# Patient Record
Sex: Female | Born: 1985 | Race: White | Hispanic: No | Marital: Single | Smoking: Never smoker
Health system: Southern US, Community
[De-identification: ages and names within clinical notes are randomized; demographics above are authoritative.]

## PROBLEM LIST (undated history)

## (undated) DIAGNOSIS — N390 Urinary tract infection, site not specified: Secondary | ICD-10-CM

## (undated) DIAGNOSIS — R87619 Unspecified abnormal cytological findings in specimens from cervix uteri: Secondary | ICD-10-CM

## (undated) DIAGNOSIS — T7840XA Allergy, unspecified, initial encounter: Secondary | ICD-10-CM

## (undated) DIAGNOSIS — N83209 Unspecified ovarian cyst, unspecified side: Secondary | ICD-10-CM

## (undated) DIAGNOSIS — A499 Bacterial infection, unspecified: Secondary | ICD-10-CM

## (undated) DIAGNOSIS — M419 Scoliosis, unspecified: Secondary | ICD-10-CM

## (undated) HISTORY — PX: EXPLORATORY LAPAROTOMY: SUR591

## (undated) HISTORY — DX: Unspecified abnormal cytological findings in specimens from cervix uteri: R87.619

## (undated) HISTORY — DX: Bacterial infection, unspecified: N39.0

## (undated) HISTORY — DX: Urinary tract infection, site not specified: A49.9

## (undated) HISTORY — DX: Unspecified ovarian cyst, unspecified side: N83.209

## (undated) HISTORY — PX: DILATION AND CURETTAGE OF UTERUS: SHX78

## (undated) HISTORY — PX: OOPHORECTOMY: SHX86

## (undated) HISTORY — DX: Scoliosis, unspecified: M41.9

## (undated) HISTORY — DX: Allergy, unspecified, initial encounter: T78.40XA

## (undated) HISTORY — PX: CERVICAL BIOPSY  W/ LOOP ELECTRODE EXCISION: SUR135

---

## 2014-11-27 ENCOUNTER — Encounter: Payer: Self-pay | Admitting: Nurse Practitioner

## 2014-11-27 ENCOUNTER — Ambulatory Visit (INDEPENDENT_AMBULATORY_CARE_PROVIDER_SITE_OTHER): Payer: BLUE CROSS/BLUE SHIELD | Admitting: Nurse Practitioner

## 2014-11-27 VITALS — BP 115/80 | HR 82 | Temp 97.7°F | Ht 63.5 in | Wt 194.0 lb

## 2014-11-27 DIAGNOSIS — R21 Rash and other nonspecific skin eruption: Secondary | ICD-10-CM

## 2014-11-27 MED ORDER — LORATADINE 10 MG PO TABS: 10.0000 mg | ORAL_TABLET | Freq: Every day | ORAL | Status: DC

## 2014-11-27 MED ORDER — FAMOTIDINE 20 MG PO TABS: 20.0000 mg | ORAL_TABLET | Freq: Two times a day (BID) | ORAL | Status: DC

## 2014-11-27 NOTE — Patient Instructions (Signed)
Start pepcid & claritin. Take daily  Mild soap on skin: Dove bar soap only.  Return in 2 weeks for fasting labs & physical.

## 2014-11-27 NOTE — Progress Notes (Signed)
Pre visit review using our clinic review tool, if applicable. No additional management support is needed unless otherwise documented below in the visit note. 

## 2014-11-28 ENCOUNTER — Encounter: Payer: Self-pay | Admitting: Nurse Practitioner

## 2014-11-28 NOTE — Progress Notes (Signed)
Subjective:     Dawn Elliott is a 29 y.o. female and is here to establish care. She is accompanied by her mother today.The patient reports problems - itchy rash on hands, arms, feet, legs, face for 1 week.. No new skin products. Rash burns. She has not used any products on rash.  She was treated for cxr confirmed pneumonia 3 weeks ago. She took axithromycin for 5 days, then levaquin for 10 days, prednisone for several days. She finished all meds about 2 weeks ago. She reports SE of prednisone: insomnia, heart racing-both now resolved. She has no known allergy to these meds. Cough resolved, feels better.    History   Social History  . Marital Status: Single    Spouse Name: N/A    Number of Children: 0  . Years of Education: College   Occupational History  . CNA    Social History Main Topics  . Smoking status: Never Smoker   . Smokeless tobacco: Never Used  . Alcohol Use: 0.0 oz/week    0 Not specified per week  . Drug Use: No  . Sexual Activity: Not Currently    Birth Control/ Protection: IUD   Other Topics Concern  . Not on file   Social History Narrative   [place]wn [place] recently moved from [place]. 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   Health Maintenance  Topic Date Due  . PAP SMEAR  11/28/2014  . INFLUENZA VACCINE  07/15/2015 (Originally 06/15/2014)  . TETANUS/TDAP  11/29/2019    The following portions of the patient's history were reviewed and updated as appropriate: allergies, current medications, past family history, past medical history, past social history, past surgical history and problem list.  Review of Systems Constitutional: negative for fatigue and fevers Ears, nose, mouth, throat, and face: negative for nasal congestion and sore throat Gastrointestinal: negative for abdominal pain and change in bowel habits Musculoskeletal:negative for arthralgias and myalgias   Objective:    BP 115/80 mmHg  Pulse 82  Temp(Src) 97.7 F (36.5 C)  (Temporal)  Ht 5' 3.5" (1.613 m)  Wt 194 lb (87.998 kg)  BMI 33.82 kg/m2  SpO2 98% General appearance: alert, cooperative, appears stated age and no distress Head: Normocephalic, without obvious abnormality, atraumatic Eyes: negative findings: lids and lashes normal and conjunctivae and sclerae normal Ears: normal TM's and external ear canals both ears Throat: lips, mucosa, and tongue normal; teeth and gums normal Neck: no adenopathy, supple, symmetrical, trachea midline and thyroid not enlarged, symmetric, no tenderness/mass/nodules Lungs: clear to auscultation bilaterally Heart: regular rate and rhythm, S1, S2 normal, no murmur, click, rub or gallop Skin: fine MP rash backs of hands, front of L ankle. mild erythema across cheeks, no raised lesions.    Assessment:Plan     1. Rash DD: drug rash, post-viral exanthem - loratadine (CLARITIN) 10 MG tablet; Take 1 tablet (10 mg total) by mouth daily.  Dispense: 14 tablet; Refill: 0 - famotidine (PEPCID) 20 MG tablet; Take 1 tablet (20 mg total) by mouth 2 (two) times daily.  Dispense: 14 tablet; Refill: 0 F/u 2 weeks_rash, cpe w/pap

## 2014-12-02 ENCOUNTER — Encounter: Payer: Self-pay | Admitting: Nurse Practitioner

## 2014-12-02 ENCOUNTER — Ambulatory Visit (INDEPENDENT_AMBULATORY_CARE_PROVIDER_SITE_OTHER): Payer: BLUE CROSS/BLUE SHIELD | Admitting: Nurse Practitioner

## 2014-12-02 ENCOUNTER — Encounter: Payer: Self-pay | Admitting: *Deleted

## 2014-12-02 VITALS — BP 104/70 | HR 70 | Temp 98.0°F | Ht 63.5 in | Wt 198.0 lb

## 2014-12-02 DIAGNOSIS — B009 Herpesviral infection, unspecified: Secondary | ICD-10-CM | POA: Insufficient documentation

## 2014-12-02 MED ORDER — VALACYCLOVIR HCL 1 G PO TABS: 2000.0000 mg | ORAL_TABLET | Freq: Two times a day (BID) | ORAL | Status: DC

## 2014-12-02 NOTE — Progress Notes (Signed)
Pre visit review using our clinic review tool, if applicable. No additional management support is needed unless otherwise documented below in the visit note. 

## 2014-12-02 NOTE — Patient Instructions (Signed)
Start valtrex as directed. Lymphadenopathy should resolve in a week or so. In the future, You may use lysine 1000mg  three times daily for several days when you first notice tingly sensation. Often this will prevent blister from appearing. Use lysine or valtrex, not both.   Herpes Simplex Herpes simplex is generally classified as Type 1 or Type 2. Type 1 is generally the type that is responsible for cold sores. Type 2 is generally associated with sexually transmitted diseases. We now know that most of the thoughts on these viruses are inaccurate. We find that HSV1 is also present genitally and HSV2 can be present orally, but this will vary in different locations of the world. Herpes simplex is usually detected by doing a culture. Blood tests are also available for this virus; however, the accuracy is often not as good.  PREPARATION FOR TEST No preparation or fasting is necessary. NORMAL FINDINGS  No virus present  No HSV antigens or antibodies present Ranges for normal findings may vary among different laboratories and hospitals. You should always check with your doctor after having lab work or other tests done to discuss the meaning of your test results and whether your values are considered within normal limits. MEANING OF TEST  Your caregiver will go over the test results with you and discuss the importance and meaning of your results, as well as treatment options and the need for additional tests if necessary. OBTAINING THE TEST RESULTS  It is your responsibility to obtain your test results. Ask the lab or department performing the test when and how you will get your results. Document Released: 12/04/2004 Document Revised: 01/24/2012 Document Reviewed: 10/12/2008 [facility] Patient Information 2015 [place], [place]. This information is not intended to replace advice given to you by your health care provider. Make sure you discuss any questions you have with your health care provider.

## 2014-12-03 NOTE — Progress Notes (Signed)
Subjective:     Dawn LocksSierra Elliott is a 29 y.o. female presents w/lesion on upper lip, & sore throat on R side. Some wheezing this am. Denies fever, HA. Feels fatigued. Symptoms started yesterday. Pt was seen few days ago w/fine papular rash on back of hands-looked like drug rash She had been on 10d levaquin & prednisone 2 weeks prior. Rash resolved the next day without treatment. She has Hx HSV. Last outbreak on lip was about 6 yrs ago. She has not taken anything for relief. She has Hx of asthma. Has not used inhaler since 2005. She states she was diagnosed by allergist, pulm func tests performed.  The following portions of the patient's history were reviewed and updated as appropriate: allergies, current medications, past medical history, past social history, past surgical history and problem list.  Review of Systems Respiratory: negative for cough Cardiovascular: negative for exertional chest pressure/discomfort and palpitations Gastrointestinal: negative for abdominal pain, change in bowel habits, constipation, diarrhea, dyspepsia, nausea and vomiting    Objective:    BP 104/70 mmHg  Pulse 70  Temp(Src) 98 F (36.7 C) (Oral)  Ht 5' 3.5" (1.613 m)  Wt 198 lb (89.812 kg)  BMI 34.52 kg/m2  SpO2 99% BP 104/70 mmHg  Pulse 70  Temp(Src) 98 F (36.7 C) (Oral)  Ht 5' 3.5" (1.613 m)  Wt 198 lb (89.812 kg)  BMI 34.52 kg/m2  SpO2 99% General appearance: alert, cooperative, appears stated age and no distress Head: Normocephalic, without obvious abnormality, atraumatic Eyes: negative findings: lids and lashes normal and conjunctivae and sclerae normal Ears: normal TM's and external ear canals both ears Nose: no lesions Throat: normal findings: buccal mucosa normal, palate normal, tongue midline and normal, soft palate, uvula, and tonsils normal and oropharynx pink & moist without lesions or evidence of thrush and abnormal findings: herpetic lesion and L upper lip Neck: supple, symmetrical,  trachea midline, thyroid not enlarged, symmetric, no tenderness/mass/nodules and L tonsilar LAD, tender Lungs: clear to auscultation bilaterally Heart: regular rate and rhythm, S1, S2 normal, no murmur, click, rub or gallop    Assessment: Plan     1. HSV (herpes simplex virus) infection LAD, L tonsillar - valACYclovir (VALTREX) 1000 MG tablet; Take 2 tablets (2,000 mg total) by mouth every 12 (twelve) hours.  Dispense: 4 tablet; Refill: 2 See pt instructions F/u PRN-has appt scheduled for CPE next week

## 2014-12-10 ENCOUNTER — Ambulatory Visit (INDEPENDENT_AMBULATORY_CARE_PROVIDER_SITE_OTHER): Payer: BLUE CROSS/BLUE SHIELD | Admitting: Nurse Practitioner

## 2014-12-10 ENCOUNTER — Encounter: Payer: Self-pay | Admitting: Nurse Practitioner

## 2014-12-10 ENCOUNTER — Other Ambulatory Visit (HOSPITAL_COMMUNITY)
Admission: RE | Admit: 2014-12-10 | Discharge: 2014-12-10 | Disposition: A | Discharge status: Home / Self Care | Payer: BLUE CROSS/BLUE SHIELD | Source: Ambulatory Visit | Attending: Nurse Practitioner | Admitting: Nurse Practitioner

## 2014-12-10 VITALS — BP 120/80 | HR 76 | Temp 98.2°F | Ht 63.5 in | Wt 199.0 lb

## 2014-12-10 DIAGNOSIS — N760 Acute vaginitis: Secondary | ICD-10-CM | POA: Diagnosis present

## 2014-12-10 DIAGNOSIS — Z113 Encounter for screening for infections with a predominantly sexual mode of transmission: Secondary | ICD-10-CM | POA: Diagnosis present

## 2014-12-10 DIAGNOSIS — Z1151 Encounter for screening for human papillomavirus (HPV): Secondary | ICD-10-CM | POA: Diagnosis present

## 2014-12-10 DIAGNOSIS — F4321 Adjustment disorder with depressed mood: Secondary | ICD-10-CM

## 2014-12-10 DIAGNOSIS — Z01419 Encounter for gynecological examination (general) (routine) without abnormal findings: Secondary | ICD-10-CM | POA: Diagnosis not present

## 2014-12-10 DIAGNOSIS — Z0000 Encounter for general adult medical examination without abnormal findings: Secondary | ICD-10-CM

## 2014-12-10 DIAGNOSIS — Z6834 Body mass index (BMI) 34.0-34.9, adult: Secondary | ICD-10-CM

## 2014-12-10 LAB — URINALYSIS, MICROSCOPIC ONLY

## 2014-12-10 LAB — CBC WITH DIFFERENTIAL/PLATELET
Basophils Absolute: 0 10*3/uL (ref 0.0–0.1)
Basophils Relative: 0.4 % (ref 0.0–3.0)
EOS ABS: 0.1 10*3/uL (ref 0.0–0.7)
EOS PCT: 0.8 % (ref 0.0–5.0)
HCT: 40.3 % (ref 36.0–46.0)
HEMOGLOBIN: 13.7 g/dL (ref 12.0–15.0)
Lymphocytes Relative: 21.7 % (ref 12.0–46.0)
Lymphs Abs: 1.6 10*3/uL (ref 0.7–4.0)
MCHC: 33.9 g/dL (ref 30.0–36.0)
MCV: 93.7 fl (ref 78.0–100.0)
Monocytes Absolute: 0.4 10*3/uL (ref 0.1–1.0)
Monocytes Relative: 5.4 % (ref 3.0–12.0)
NEUTROS ABS: 5.4 10*3/uL (ref 1.4–7.7)
Neutrophils Relative %: 71.7 % (ref 43.0–77.0)
PLATELETS: 271 10*3/uL (ref 150.0–400.0)
RBC: 4.3 Mil/uL (ref 3.87–5.11)
RDW: 13.8 % (ref 11.5–15.5)
WBC: 7.5 10*3/uL (ref 4.0–10.5)

## 2014-12-10 LAB — T4, FREE: Free T4: 0.86 ng/dL (ref 0.60–1.60)

## 2014-12-10 LAB — HEMOGLOBIN A1C: Hgb A1c MFr Bld: 5.3 % (ref 4.6–6.5)

## 2014-12-10 LAB — TSH: TSH: 1.51 u[IU]/mL (ref 0.35–4.50)

## 2014-12-10 NOTE — Patient Instructions (Signed)
My office will call with lab results.  Please see therapist. See me after you have had 2 sessions. We will discuss medications further if needed.  Develop lifelong habits of exercise most days of the week: take a 30 minute walk. The benefits include weight loss, lower risk for heart disease, diabetes, stroke, high blood pressure, lower rates of depression & dementia, better sleep quality & bone health.  Drink 8 oz water every 2 hours while at work-this will help ankle swelling.  Eat foods that nourish your body: nuts seeds, fruits & vegetables. Make a goal of eating 5 servings every day.  Start daily sinus rinse for allergy management. Studies show sinus rinses can be as effective as allergy medicines for some people.  See you in 6 weeks or so.

## 2014-12-10 NOTE — Progress Notes (Signed)
Pre visit review using our clinic review tool, if applicable. No additional management support is needed unless otherwise documented below in the visit note. 

## 2014-12-11 LAB — COMPREHENSIVE METABOLIC PANEL
ALK PHOS: 50 U/L (ref 39–117)
ALT: 10 U/L (ref 0–35)
AST: 16 U/L (ref 0–37)
Albumin: 4.7 g/dL (ref 3.5–5.2)
BILIRUBIN TOTAL: 1 mg/dL (ref 0.2–1.2)
BUN: 14 mg/dL (ref 6–23)
CO2: 27 mEq/L (ref 19–32)
Calcium: 9.1 mg/dL (ref 8.4–10.5)
Chloride: 106 mEq/L (ref 96–112)
Creatinine, Ser: 0.64 mg/dL (ref 0.40–1.20)
GFR: 118.27 mL/min (ref >60.00)
Glucose, Bld: 90 mg/dL (ref 70–99)
Potassium: 4.2 mEq/L (ref 3.5–5.1)
SODIUM: 138 meq/L (ref 135–145)
TOTAL PROTEIN: 7.2 g/dL (ref 6.0–8.3)

## 2014-12-11 LAB — THYROID PEROXIDASE ANTIBODY: Thyroperoxidase Ab SerPl-aCnc: 1 IU/mL (ref <9)

## 2014-12-11 LAB — LIPID PANEL
CHOLESTEROL: 139 mg/dL (ref 0–200)
HDL: 52.2 mg/dL (ref >39.00)
LDL CALC: 77 mg/dL (ref 0–99)
NonHDL: 86.8
TRIGLYCERIDES: 50 mg/dL (ref 0.0–149.0)
Total CHOL/HDL Ratio: 3
VLDL: 10 mg/dL (ref 0.0–40.0)

## 2014-12-11 LAB — HIV ANTIBODY (ROUTINE TESTING W REFLEX): HIV 1&2 Ab, 4th Generation: NONREACTIVE

## 2014-12-11 LAB — CYTOLOGY - PAP

## 2014-12-11 LAB — RPR

## 2014-12-12 ENCOUNTER — Telehealth: Payer: Self-pay | Admitting: Nurse Practitioner

## 2014-12-12 NOTE — Telephone Encounter (Signed)
pls call pt: Advise All labs are normal. I will see her at f/u appt

## 2014-12-13 NOTE — Telephone Encounter (Signed)
Left detailed message on vm. Per pt's DPR. 

## 2014-12-14 DIAGNOSIS — Z0000 Encounter for general adult medical examination without abnormal findings: Secondary | ICD-10-CM | POA: Insufficient documentation

## 2014-12-14 DIAGNOSIS — Z6834 Body mass index (BMI) 34.0-34.9, adult: Secondary | ICD-10-CM | POA: Insufficient documentation

## 2014-12-14 DIAGNOSIS — F4321 Adjustment disorder with depressed mood: Secondary | ICD-10-CM | POA: Insufficient documentation

## 2014-12-14 NOTE — Progress Notes (Addendum)
Subjective:     Dawn Elliott is a 29 y.o. female and is here for a comprehensive physical exam. The patient reports problems - feels depressed. CPE: reviewed HX records. Vaccines UTD. Due for Pap- Hx abnml pap. BMI 34. Not exercising daily, plays tennis on Sat. Uses mirena for birth control-placed 2 ya. Depressed: expresses no motivation, wants to sleep when not at work, feels tired, Has Hx of 4 yr marriage followed by divorce, then 4 yr relationship recently broken, miscarriage, expresses feels pressure of not "being where people think she should be" ie. Married with kids or in school furthering education. Has been treated for depression in past-when teen & had living arrangement changes. Doesn't remember med she used or if it was helpful.   History   Social History  . Marital Status: Single    Spouse Name: N/A    Number of Children: 0  . Years of Education: College   Occupational History  . CNA    Social History Main Topics  . Smoking status: Never Smoker   . Smokeless tobacco: Never Used  . Alcohol Use: 0.0 oz/week    0 Not specified per week  . Drug Use: No  . Sexual Activity: Not Currently    Birth Control/ Protection: IUD   Other Topics Concern  . Not on file   Social History Narrative   [place] Dawn [place] from [place]. 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   Health Maintenance  Topic Date Due  . INFLUENZA VACCINE  07/15/2015 (Originally 06/15/2014)  . PAP SMEAR  12/10/2017  . TETANUS/TDAP  11/29/2019    The following portions of the patient's history were reviewed and updated as appropriate: allergies, current medications, past medical history, past social history, past surgical history and problem list.  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, negative for palpitations Gastrointestinal: negative for change in bowel habits, constipation, diarrhea and reflux symptoms Genitourinary:negative, has occasional spotting w/mirena, requests STI testing Neurological: negative for headaches Behavioral/Psych: positive for remote occasional marijuana use, negative for excessive alcohol consumption  Skin: HSV outbreak on face healing, drug rash resolved, using claritin & pepcid Lymph: LAD L neck resolved Objective:    BP 120/80 mmHg  Pulse 76  Temp(Src) 98.2 F (36.8 C) (Oral)  Ht 5' 3.5" (1.613 m)  Wt 199 lb (90.266 kg)  BMI 34.69 kg/m2  SpO2 97% General appearance: alert, cooperative, appears stated age and no distress Head: Normocephalic, without obvious abnormality, atraumatic Eyes: negative findings: lids and lashes normal, conjunctivae and sclerae normal, corneas clear and pupils equal, round, reactive to light and accomodation Ears: normal TM's and external ear canals both ears Throat: normal findings: buccal mucosa normal, gums healthy, teeth intact, non-carious, tongue midline and normal and soft palate, uvula, and tonsils normal and abnormal findings: healing lesion L upper lip, she was treated for HSV w/valtrex 2 wks ago Neck: no adenopathy, no carotid bruit, supple, symmetrical, trachea midline and thyroid not enlarged, symmetric, no tenderness/mass/nodules Lungs: clear to auscultation bilaterally Breasts: No axillary or supraclavicular adenopathy, Normal to palpation without dominant masses, bilat nipple piercings Heart: regular rate and rhythm, S1, S2 normal, no murmur, click, rub or gallop Abdomen: soft, non-tender; bowel sounds normal; no masses,  no organomegaly  Pelvic: cervix normal in appearance, external genitalia normal, no adnexal masses or tenderness, no cervical motion tenderness, perianal skin: no external genital warts noted, uterus normal size,  shape, and consistency, vagina normal without discharge and clitoral hood piercing, anatomy variance under urethra-small horn shaped protrusion. no erythehma, NT Extremities: extremities normal, atraumatic, no cyanosis or edema Pulses: 2+ and symmetric Skin: Skin color, texture, turgor normal. No rashes or lesions Lymph nodes: Cervical, supraclavicular, and axillary nodes normal. Neurologic: Grossly normal   Behavioral health: Full facial expressions, well groomed, interactive, talkative yet Scored 23 on PHQ-9-mod-severe depression. Answers "several days" to Q #9 "thoughts that you would be better off dead or of hurting self". She does not present as if severely depressed. genital piercings & multiple tattoos.    Assessment:Plan   1. Preventative health care - CBC with Differential/Platelet - Comprehensive metabolic panel - Lipid panel - Hemoglobin A1c - HIV antibody - T4, free - Thyroid peroxidase antibody - TSH - Urine Microscopic - RPR - Cytology - PAP  2. BMI 34.0-34.9,adult 30 minutes exercise daily Drink more water at work Increase fruits & vegetables - T4, free - Thyroid peroxidase antibody - TSH  3. Situational depression - Ambulatory referral to Behavioral Health for grief counseling r/t broken marriage & other romantic relationship. Goal reach resolve. Develop stronger sense of self to achieve healthy balance of her choices & family/society expectations. Lives with mother-new situation. Will call reach out by phone w/pt in few days F/u after she has had 2 sessions with therapist. consider starting SSRI if indicated

## 2014-12-17 ENCOUNTER — Ambulatory Visit (INDEPENDENT_AMBULATORY_CARE_PROVIDER_SITE_OTHER): Payer: BLUE CROSS/BLUE SHIELD | Admitting: Nurse Practitioner

## 2014-12-17 ENCOUNTER — Ambulatory Visit: Payer: BLUE CROSS/BLUE SHIELD | Admitting: Nurse Practitioner

## 2014-12-17 ENCOUNTER — Encounter: Payer: Self-pay | Admitting: *Deleted

## 2014-12-17 ENCOUNTER — Encounter: Payer: Self-pay | Admitting: Nurse Practitioner

## 2014-12-17 VITALS — BP 118/84 | HR 76 | Temp 98.0°F | Ht 63.5 in | Wt 198.0 lb

## 2014-12-17 DIAGNOSIS — F411 Generalized anxiety disorder: Secondary | ICD-10-CM

## 2014-12-17 MED ORDER — LORAZEPAM 0.5 MG PO TABS: 0.5000 mg | ORAL_TABLET | Freq: Two times a day (BID) | ORAL | Status: AC | PRN

## 2014-12-17 NOTE — Progress Notes (Signed)
Pre visit review using our clinic review tool, if applicable. No additional management support is needed unless otherwise documented below in the visit note. 

## 2014-12-17 NOTE — Patient Instructions (Signed)
Take 400 to 600 mg ibuprophen twice daily with food for 3 days, then as needed. Use ice followed by heat: 10 minutes each. Three times daily for few days.  Then heat only if needed.  Use ativan as needed for anxiety or panic attack. See therapist.  F/u after 2 therapy sessions.
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
## 2015-01-08 ENCOUNTER — Telehealth: Payer: Self-pay | Admitting: *Deleted

## 2015-01-08 NOTE — Telephone Encounter (Signed)
Patient notified

## 2015-01-08 NOTE — Telephone Encounter (Signed)
[facility] is [facility]-same copay as coming to PCP ofc.  But if she is having vaginal symptoms-discharge, itching, odor she will have to go to urgent care as needs vaginal exam.

## 2015-01-08 NOTE — Telephone Encounter (Signed)
Patient called office requesting diflucan rx for uti. Patient stated that she is in [place] for the week and if you agree to give the antibiotic, she would [facility] [place]. Advised pt that she needs to be seen in office. Patient still requested that I ask you for rx. Please advise?

## 2015-01-21 ENCOUNTER — Encounter: Payer: Self-pay | Admitting: Nurse Practitioner

## 2015-01-21 ENCOUNTER — Ambulatory Visit (INDEPENDENT_AMBULATORY_CARE_PROVIDER_SITE_OTHER): Payer: BLUE CROSS/BLUE SHIELD | Admitting: Nurse Practitioner

## 2015-01-21 VITALS — BP 115/79 | HR 89 | Temp 98.4°F | Ht 63.0 in | Wt 199.0 lb

## 2015-01-21 DIAGNOSIS — N3001 Acute cystitis with hematuria: Secondary | ICD-10-CM

## 2015-01-21 DIAGNOSIS — R300 Dysuria: Secondary | ICD-10-CM

## 2015-01-21 LAB — POCT URINALYSIS DIPSTICK
Bilirubin, UA: NEGATIVE
Glucose, UA: NEGATIVE
Ketones, UA: NEGATIVE
NITRITE UA: NEGATIVE
PH UA: 5.5
Protein, UA: 100
Spec Grav, UA: >=1.03
Urobilinogen, UA: 0.2

## 2015-01-21 LAB — URINALYSIS, MICROSCOPIC ONLY

## 2015-01-21 MED ORDER — SULFAMETHOXAZOLE-TRIMETHOPRIM 800-160 MG PO TABS: 1.0000 | ORAL_TABLET | Freq: Two times a day (BID) | ORAL | Status: DC

## 2015-01-21 NOTE — Progress Notes (Signed)
Pre visit review using our clinic review tool, if applicable. No additional management support is needed unless otherwise documented below in the visit note. 

## 2015-01-21 NOTE — Patient Instructions (Signed)
Start antibiotic. Our office will call if we need to change the antibiotic. Take pyridium to relax bladder, caution: urine tears & sweat will be orange. Do not be alarmed! Sip hydrating fluids (water, juice, colorless soda, decaff tea) every hour to flush kidneys.  Eat fresh fruit every day-berries & pears are loaded with fiber. Drink water rather than soda & tea. Urinary Tract Infection Urinary tract infections (UTIs) can develop anywhere along your urinary tract. Your urinary tract is your body's drainage system for removing wastes and extra water. Your urinary tract includes two kidneys, two ureters, a bladder, and a urethra. Your kidneys are a pair of bean-shaped organs. Each kidney is about the size of your fist. They are located below your ribs, one on each side of your spine. CAUSES Infections are caused by microbes, which are microscopic organisms, including fungi, viruses, and bacteria. These organisms are so small that they can only be seen through a microscope. Bacteria are the microbes that most commonly cause UTIs. SYMPTOMS  Symptoms of UTIs may vary by age and gender of the patient and by the location of the infection. Symptoms in young women typically include a frequent and intense urge to urinate and a painful, burning feeling in the bladder or urethra during urination. Older women and men are more likely to be tired, shaky, and weak and have muscle aches and abdominal pain. A fever may mean the infection is in your kidneys. Other symptoms of a kidney infection include pain in your back or sides below the ribs, nausea, and vomiting. DIAGNOSIS To diagnose a UTI, your caregiver will ask you about your symptoms. Your caregiver also will ask to provide a urine sample. The urine sample will be tested for bacteria and white blood cells. White blood cells are made by your body to help fight infection. TREATMENT  Typically, UTIs can be treated with medication. Because most UTIs are caused by a  bacterial infection, they usually can be treated with the use of antibiotics. The choice of antibiotic and length of treatment depend on your symptoms and the type of bacteria causing your infection. HOME CARE INSTRUCTIONS  If you were prescribed antibiotics, take them exactly as your caregiver instructs you. Finish the medication even if you feel better after you have only taken some of the medication.  Drink enough water and fluids to keep your urine clear or pale yellow.  Avoid caffeine, tea, and carbonated beverages. They tend to irritate your bladder.  Empty your bladder often. Avoid holding urine for long periods of time.  Empty your bladder before and after sexual intercourse.  After a bowel movement, women should cleanse from front to back. Use each tissue only once. SEEK MEDICAL CARE IF:   You have back pain.  You develop a fever.  Your symptoms do not begin to resolve within 3 days. SEEK IMMEDIATE MEDICAL CARE IF:   You have severe back pain or lower abdominal pain.  You develop chills.  You have nausea or vomiting.  You have continued burning or discomfort with urination. MAKE SURE YOU:   Understand these instructions.  Will watch your condition.  Will get help right away if you are not doing well or get worse. Document Released: 08/11/2005 Document Revised: 05/02/2012 Document Reviewed: 12/10/2011 [facility] Patient Information 2014 [place], [place].

## 2015-01-23 ENCOUNTER — Encounter: Payer: Self-pay | Admitting: Nurse Practitioner

## 2015-01-23 ENCOUNTER — Ambulatory Visit (INDEPENDENT_AMBULATORY_CARE_PROVIDER_SITE_OTHER): Payer: BLUE CROSS/BLUE SHIELD | Admitting: Nurse Practitioner

## 2015-01-23 VITALS — BP 119/81 | HR 76 | Temp 98.0°F | Ht 63.0 in | Wt 198.0 lb

## 2015-01-23 DIAGNOSIS — Z202 Contact with and (suspected) exposure to infections with a predominantly sexual mode of transmission: Secondary | ICD-10-CM

## 2015-01-23 LAB — URINE CULTURE: Colony Count: 50000

## 2015-01-23 LAB — WET PREP, GENITAL
TRICH WET PREP: NONE SEEN — AB
Yeast Wet Prep HPF POC: NONE SEEN — AB

## 2015-01-23 NOTE — Patient Instructions (Signed)
My office will call with lab results.
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
## 2015-01-23 NOTE — Progress Notes (Signed)
Pre visit review using our clinic review tool, if applicable. No additional management support is needed unless otherwise documented below in the visit note. 

## 2015-01-24 LAB — GC/CHLAMYDIA PROBE AMP
CT Probe RNA: NEGATIVE
GC Probe RNA: NEGATIVE

## 2015-01-24 LAB — HIV ANTIBODY (ROUTINE TESTING W REFLEX): HIV 1&2 Ab, 4th Generation: NONREACTIVE

## 2015-01-24 LAB — RPR

## 2015-01-26 ENCOUNTER — Telehealth: Payer: Self-pay | Admitting: Nurse Practitioner

## 2015-01-26 DIAGNOSIS — B9689 Other specified bacterial agents as the cause of diseases classified elsewhere: Secondary | ICD-10-CM

## 2015-01-26 DIAGNOSIS — N760 Acute vaginitis: Principal | ICD-10-CM

## 2015-01-26 MED ORDER — METRONIDAZOLE 500 MG PO TABS: 500.0000 mg | ORAL_TABLET | Freq: Two times a day (BID) | ORAL | Status: DC

## 2015-01-26 NOTE — Telephone Encounter (Signed)
pls call pt: Advise All tests are neg for STD. She does have bacterial vaginitis, which is an overgrowth of normal bacteria, which can occur from sexual activity & could cause irritation in partner.  Start metronidazole. Take after eating as may cause nausea. DO NOT drink alcohol while on medication or for 1 day after last dose.

## 2015-01-27 NOTE — Telephone Encounter (Signed)
Patient called back and I informed her of her results.

## 2015-01-27 NOTE — Telephone Encounter (Signed)
Called patient, unable to leave voicemail due to a full mailbox.

## 2015-01-27 NOTE — Progress Notes (Signed)
   Subjective:    Patient ID: Dawn Elliott, female    DOB: 05-01-1986, 29 y.o.   MRN: [id number]  Dysuria  This is a new problem. The current episode started in the past 7 days (3d). The problem occurs every urination. The problem has been gradually worsening. The quality of the pain is described as burning. The pain is mild. There has been no fever. She is sexually active. There is no history of pyelonephritis. Associated symptoms include flank pain, frequency and urgency. Pertinent negatives include no chills, discharge, hesitancy or nausea. Associated symptoms comments: Malodorous urine. She has tried nothing for the symptoms.      Review of Systems  Constitutional: Negative for fever, chills and fatigue.  Gastrointestinal: Positive for constipation. Negative for nausea.  Genitourinary: Positive for dysuria, urgency, frequency and flank pain. Negative for hesitancy.  Skin: Positive for rash.       Macular erythema arms few days ago, resolved.       Objective:   Physical Exam  Constitutional: She is oriented to person, place, and time. She appears well-developed and well-nourished. No distress.  HENT:  Head: Normocephalic and atraumatic.  Eyes: Conjunctivae are normal. Right eye exhibits no discharge. Left eye exhibits no discharge.  Cardiovascular: Normal rate.   Pulmonary/Chest: Effort normal. No respiratory distress.  Abdominal: Soft. Bowel sounds are normal. She exhibits no distension and no mass. There is no tenderness. There is no rebound and no guarding.  Musculoskeletal: She exhibits no tenderness (no CVA tenderness).  Neurological: She is alert and oriented to person, place, and time.  Skin: Skin is warm and dry.  Psychiatric: She has a normal mood and affect. Her behavior is normal. Thought content normal.  Vitals reviewed.         Assessment & Plan:  1. Dysuria - POCT urinalysis dipstick-pos blood, leuks - Urine culture - Urine Microscopic  2. Acute cystitis  with hematuria - sulfamethoxazole-trimethoprim (BACTRIM DS,SEPTRA DS) 800-160 MG per tablet; Take 1 tablet by mouth 2 (two) times daily.  Dispense: 6 tablet; Refill: 0 See pt instructions F/u prn

## 2015-01-30 ENCOUNTER — Telehealth: Payer: Self-pay

## 2015-01-30 DIAGNOSIS — N760 Acute vaginitis: Principal | ICD-10-CM

## 2015-01-30 DIAGNOSIS — B9689 Other specified bacterial agents as the cause of diseases classified elsewhere: Secondary | ICD-10-CM

## 2015-01-30 NOTE — Telephone Encounter (Signed)
Called to speak with patient about medicine that was sent in, just wanted to double check that it was the correct pharmacy that [facility] sent the metronidazole too. Patient did not answer and was unable to LM

## 2015-01-31 MED ORDER — METRONIDAZOLE 500 MG PO TABS: 500.0000 mg | ORAL_TABLET | Freq: Two times a day (BID) | ORAL | Status: DC

## 2015-01-31 NOTE — Telephone Encounter (Signed)
Attempted to call patient again, no answer and unable to LM. I resent the [facility] to [facility] [address].

## 2015-02-04 ENCOUNTER — Ambulatory Visit (INDEPENDENT_AMBULATORY_CARE_PROVIDER_SITE_OTHER): Payer: BLUE CROSS/BLUE SHIELD | Admitting: Nurse Practitioner

## 2015-02-04 ENCOUNTER — Encounter: Payer: Self-pay | Admitting: Nurse Practitioner

## 2015-02-04 VITALS — BP 108/75 | HR 86 | Temp 98.4°F | Ht 63.0 in | Wt 197.0 lb

## 2015-02-04 DIAGNOSIS — H109 Unspecified conjunctivitis: Secondary | ICD-10-CM

## 2015-02-04 DIAGNOSIS — H1089 Other conjunctivitis: Secondary | ICD-10-CM

## 2015-02-04 DIAGNOSIS — A499 Bacterial infection, unspecified: Secondary | ICD-10-CM

## 2015-02-04 MED ORDER — POLYMYXIN B-TRIMETHOPRIM 10000-0.1 UNIT/ML-% OP SOLN: 1.0000 [drp] | OPHTHALMIC | Status: DC

## 2015-02-04 NOTE — Progress Notes (Signed)
   Subjective:    Patient ID: Dawn LocksSierra [place], female    DOB: [birth year], 29 y.o.   MRN: [id number]  Conjunctivitis  The current episode started yesterday. The problem has been gradually worsening. Associated symptoms include eye itching, eye discharge and eye redness. Pertinent negatives include no fever, no decreased vision, no double vision, no congestion, no headaches and no sore throat. The right eye is affected. The eye pain is not associated with movement. The eyelid exhibits redness. There were no sick contacts (at hospital all day w/mom 2 days ago (in ER)).      Review of Systems  Constitutional: Negative for fever.  HENT: Negative for congestion and sore throat.   Eyes: Positive for discharge, redness and itching. Negative for double vision.  Neurological: Negative for headaches.       Objective:   Physical Exam  Constitutional: She is oriented to person, place, and time. She appears well-developed and well-nourished. No distress.  HENT:  Head: Normocephalic and atraumatic.  Mouth/Throat: Oropharynx is clear and moist. No oropharyngeal exudate.  Eyes: EOM are normal. Pupils are equal, round, and reactive to light. Right eye exhibits no chemosis, no discharge, no exudate and no hordeolum. No foreign body present in the right eye. Left eye exhibits no discharge, no exudate and no hordeolum. No foreign body present in the left eye. Right conjunctiva is injected. Right conjunctiva has no hemorrhage. Left conjunctiva is not injected.  Mild conj injection R eye  Neck: Normal range of motion. No thyromegaly present.  Cardiovascular: Normal rate.   Pulmonary/Chest: Effort normal.  Lymphadenopathy:    She has no cervical adenopathy.  Neurological: She is alert and oriented to person, place, and time.  Skin: Skin is warm and dry.  Psychiatric: She has a normal mood and affect. Her behavior is normal. Thought content normal.  Vitals reviewed.         Assessment & Plan:  1.  Bacterial conjunctivitis of left eye DD: allergenic, viral - trimethoprim-polymyxin b (POLYTRIM) ophthalmic solution; Place 1 drop into the right eye every 4 (four) hours.  Dispense: 10 mL; Refill: 0  F/u PRN

## 2015-02-04 NOTE — Patient Instructions (Signed)
Use drops for 24 hours longer than you have symptoms. If still having symptoms in 10 days, call for re-evaluation. Irrigate eyes with saline eye drops also. Wash lashes with diluted baby shampoo-1:1 solution.   Bacterial Conjunctivitis Bacterial conjunctivitis, commonly called pink eye, is an inflammation of the clear membrane that covers the white part of the eye (conjunctiva). The inflammation can also happen on the underside of the eyelids. The blood vessels in the conjunctiva become inflamed causing the eye to become red or pink. Bacterial conjunctivitis may spread easily from one eye to another and from person to person (contagious).  CAUSES  Bacterial conjunctivitis is caused by bacteria. The bacteria may come from your own skin, your upper respiratory tract, or from someone else with bacterial conjunctivitis. SYMPTOMS  The normally white color of the eye or the underside of the eyelid is usually pink or red. The pink eye is usually associated with irritation, tearing, and some sensitivity to light. Bacterial conjunctivitis is often associated with a thick, yellowish discharge from the eye. The discharge may turn into a crust on the eyelids overnight, which causes your eyelids to stick together. If a discharge is present, there may also be some blurred vision in the affected eye. DIAGNOSIS  Bacterial conjunctivitis is diagnosed by your caregiver through an eye exam and the symptoms that you report. Your caregiver looks for changes in the surface tissues of your eyes, which may point to the specific type of conjunctivitis. A sample of any discharge may be collected on a cotton-tip swab if you have a severe case of conjunctivitis, if your cornea is affected, or if you keep getting repeat infections that do not respond to treatment. The sample will be sent to a lab to see if the inflammation is caused by a bacterial infection and to see if the infection will respond to antibiotic medicines. TREATMENT     Bacterial conjunctivitis is treated with antibiotics. Antibiotic eyedrops are most often used. However, antibiotic ointments are also available. Antibiotics pills are sometimes used. Artificial tears or eye washes may ease discomfort. HOME CARE INSTRUCTIONS   To ease discomfort, apply a cool, clean wash cloth to your eye for 10 20 minutes, 3 4 times a day.  Gently wipe away any drainage from your eye with a warm, wet washcloth or a cotton ball.  Wash your hands often with soap and water. Use paper towels to dry your hands.  Do not share towels or wash cloths. This may spread the infection.  Change or wash your pillow case every day.  You should not use eye makeup until the infection is gone.  Do not operate machinery or drive if your vision is blurred.  Stop using contacts lenses. Ask your caregiver how to sterilize or replace your contacts before using them again. This depends on the type of contact lenses that you use.  When applying medicine to the infected eye, do not touch the edge of your eyelid with the eyedrop bottle or ointment tube. SEEK IMMEDIATE MEDICAL CARE IF:   Your infection has not improved within 3 days after beginning treatment.  You had yellow discharge from your eye and it returns.  You have increased eye pain.  Your eye redness is spreading.  Your vision becomes blurred.  You have a fever or persistent symptoms for more than 2 3 days.  You have a fever and your symptoms suddenly get worse.  You have facial pain, redness, or swelling. MAKE SURE YOU:   Understand  these instructions.  Will watch your condition.  Will get help right away if you are not doing well or get worse. Document Released: 11/01/2005 Document Revised: 07/26/2012 Document Reviewed: 04/03/2012 [facility] Patient Information 2014 [place], [place].

## 2015-02-04 NOTE — Progress Notes (Signed)
Pre visit review using our clinic review tool, if applicable. No additional management support is needed unless otherwise documented below in the visit note. 

## 2015-02-06 ENCOUNTER — Other Ambulatory Visit: Payer: Self-pay | Admitting: Nurse Practitioner

## 2015-02-06 ENCOUNTER — Telehealth: Payer: Self-pay

## 2015-02-06 DIAGNOSIS — H1013 Acute atopic conjunctivitis, bilateral: Secondary | ICD-10-CM

## 2015-02-06 MED ORDER — OLOPATADINE HCL 0.1 % OP SOLN: 1.0000 [drp] | Freq: Two times a day (BID) | OPHTHALMIC | Status: DC

## 2015-02-06 NOTE — Telephone Encounter (Signed)
It doesn't sound contagious, since it hasn't spread to other eye. No need to be concerned about spreading infection.

## 2015-02-06 NOTE — Telephone Encounter (Signed)
Called and informed patient. 

## 2015-02-06 NOTE — Telephone Encounter (Signed)
Please Advise? Patient called stating that her pink eye is not getting any better, was wondering if there was any other stronger medicines to use for this?

## 2015-02-06 NOTE — Telephone Encounter (Signed)
Ask if spread to both eyes, still drainage? pain or itching? Redder? Is vision affected? Inf may be caused by virus-if so, drops will not help, only time will make better-usually 7 to 10 days. OR It may be allergenic. I sent allergy eye drop. If no improvement after few days, go to Sat clinic or urgent care.

## 2015-02-06 NOTE — Telephone Encounter (Signed)
Called and spoke with patient. No has not spread, still draining, itching, is redder, no vision is not affected. Patient is worried about working, she works in a nursing home. She was scheduled to work Wed, [employer], Fri this week. I already gave her a note for Wed, Thurs;is it ok to print another one excusing her for Friday as well?

## 2015-08-26 ENCOUNTER — Other Ambulatory Visit: Payer: Self-pay | Admitting: Family Medicine

## 2015-08-26 ENCOUNTER — Ambulatory Visit (INDEPENDENT_AMBULATORY_CARE_PROVIDER_SITE_OTHER): Payer: BLUE CROSS/BLUE SHIELD | Admitting: Family Medicine

## 2015-08-26 VITALS — BP 115/81 | HR 72 | Temp 98.2°F | Resp 18 | Ht 63.0 in | Wt 178.0 lb

## 2015-08-26 DIAGNOSIS — N898 Other specified noninflammatory disorders of vagina: Secondary | ICD-10-CM | POA: Diagnosis not present

## 2015-08-26 MED ORDER — VALACYCLOVIR HCL 1 G PO TABS: 1000.0000 mg | ORAL_TABLET | Freq: Two times a day (BID) | ORAL | Status: DC

## 2015-08-26 MED ORDER — FLUCONAZOLE 150 MG PO TABS
150.0000 mg | ORAL_TABLET | Freq: Once | ORAL | Status: DC
Start: 2015-08-26 — End: 2015-09-03

## 2015-08-26 NOTE — Patient Instructions (Signed)
Safe Sex  Safe sex is about reducing the risk of giving or getting a sexually transmitted disease (STD). STDs are spread through sexual contact involving the genitals, mouth, or rectum. Some STDs can be cured and others cannot. Safe sex can also prevent unintended pregnancies.   WHAT ARE SOME SAFE SEX PRACTICES?  · Limit your sexual activity to only one partner who is having sex with only you.  · Talk to your partner about his or her past partners, past STDs, and drug use.  · Use a condom every time you have sexual intercourse. This includes vaginal, oral, and anal sexual activity. Both females and males should wear condoms during oral sex. Only use latex or polyurethane condoms and water-based lubricants. Using petroleum-based lubricants or oils to lubricate a condom will weaken the condom and increase the chance that it will break. The condom should be in place from the beginning to the end of sexual activity. Wearing a condom reduces, but does not completely eliminate, your risk of getting or giving an STD. STDs can be spread by contact with infected body fluids and skin.  · Get vaccinated for hepatitis B and HPV.  · Avoid alcohol and recreational drugs, which can affect your judgment. You may forget to use a condom or participate in high-risk sex.  · For females, avoid douching after sexual intercourse. Douching can spread an infection farther into the reproductive tract.  · Check your body for signs of sores, blisters, rashes, or unusual discharge. See your health care provider if you notice any of these signs.  · Avoid sexual contact if you have symptoms of an infection or are being treated for an STD. If you or your partner has herpes, avoid sexual contact when blisters are present. Use condoms at all other times.  · If you are at risk of being infected with HIV, it is recommended that you take a prescription medicine daily to prevent HIV infection. This is called pre-exposure prophylaxis (PrEP). You are  considered at risk if:    You are a man who has sex with other men (MSM).    You are a heterosexual man or woman who is sexually active with more than one partner.    You take drugs by injection.    You are sexually active with a partner who has HIV.  · Talk with your health care provider about whether you are at high risk of being infected with HIV. If you choose to begin PrEP, you should first be tested for HIV. You should then be tested every 3 months for as long as you are taking PrEP.  · See your health care provider for regular screenings, exams, and tests for other STDs. Before having sex with a new partner, each of you should be screened for STDs and should talk about the results with each other.  WHAT ARE THE BENEFITS OF SAFE SEX?   · There is less chance of getting or giving an STD.  · You can prevent unwanted or unintended pregnancies.  · By discussing safe sex concerns with your partner, you may increase feelings of intimacy, comfort, trust, and honesty between the two of you.     This information is not intended to replace advice given to you by your health care provider. Make sure you discuss any questions you have with your health care provider.     Document Released: 12/09/2004 Document Revised: 11/22/2014 Document Reviewed: 04/24/2012  Elsevier Interactive Patient Education ©2016 [employer] Inc[employer]

## 2015-08-26 NOTE — Progress Notes (Signed)
Subjective:    Patient ID: Dawn Elliott, female    DOB: [birth year], 29 y.o.   MRN: [id number]  HPI  Vaginal irritation: Patient presents for acute visit today with vaginal irritation. She reports when she is with her boyfriend sexually, she always gets "lacerations ". She reports she was with her boyfriend over the weekend and felt like she had vaginal tearing and anal tearing. Monday she felt that she had the start of a yeast infection, her labia became red and swollen. She also noticed multiple small bumps around her vaginal opening and anus. She states that she started to use Monistat cream externally and internally. Patient reports she does have frequent yeast infections. She denies any odor, dysuria, fevers or abdominal pain. She does admit to mild cramping today. She reports it does hurt when she urinates but just when the urine touches the irritated mucosa and lesions. Per electronic medical record review patient was seen in March 2016 for vaginal irritation, at that time she did have bacterial vaginosis, gonorrhea and Chlamydia was negative, HIV and RPR was nonreactive. Blood work in January 2016 was normal, with a hemoglobin A1c of 5.3.  Past Medical History  Diagnosis Date  . Allergy   . Urinary tract bacterial infections   . Scoliosis    Allergies  Allergen Reactions  . Eggs Or Egg-Derived Products Anaphylaxis  . Tape Itching  . Ceclor [Cefaclor] Other (See Comments)    Burning sensation on skin  . Motrin [Ibuprofen] Other (See Comments)    Burning sensation on skin  . Prednisone Palpitations  . Sudafed [Pseudoephedrine] Other (See Comments)    Burning sensation on skin   Social History   Social History  . Marital Status: Single    Spouse Name: N/A  . Number of Children: 0  . Years of Education: College   Occupational History  . CNA    Social History Main Topics  . Smoking status: Never Smoker   . Smokeless tobacco: Never Used  . Alcohol Use: 0.0 oz/week    0  Standard drinks or equivalent per week  . Drug Use: No  . Sexual Activity: Yes    Birth Control/ Protection: IUD   Other Topics Concern  . Not on file   Social History Narrative   Ms Pescador recently moved from [place]. 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 She lives with her mom.      Review of Systems Negative, with the exception of above mentioned in HPI     Objective:   Physical Exam BP 115/81 mmHg  Pulse 72  Temp(Src) 98.2 F (36.8 C) (Temporal)  Resp 18  Ht  (1.6 m)  Wt 178 lb (80.74 kg)  BMI 31.54 kg/m2  SpO2 100%  Gen: Afebrile. No acute distress. Nontoxic in appearance. Obese female.  HENT: AT. [place].  MMM. B Eyes:Pupils Equal Round Reactive to light, Extraocular movements intact,  Conjunctiva without redness, discharge or icterus. Abd: Soft. Obese. NTND. BS present. No Masses palpated.  Skin: No non-GU rashes, purpura or petechiae.   GYN:  External genitalia with erythema, superficial lacerations x 2 labia minora, >5 vesicular lesions, x2 ulceration lesion.  Vaginal mucosa pink, moist, normal rugae. Copious white/yellow thick discharge/cream.  Nonfriable cervix without lesions, no bleeding noted on speculum exam.  Bimanual exam revealed normal, nongravid uterus.  No cervical motion tenderness. No adnexal masses bilaterally.  IUD strings identified at cervical OS.     Assessment & Plan:  1. 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 Discussed the possibility of transmission of HSV1 to genital region is possible. Pt with multiple lesions, which makes more likely this is possibly new strand (HSV2). Will treat for yeast and HSV today and await cultures and wet prep. - Pt Boyfriend in the room at the time of history and exam. Patient was given the opportunity to talk and have exam completed without him present and she declined, stating she wanted him there.  - Pelvic rest 2-3 weeks. Work excuse provided for 2 days. Safe sex  AVS.   - Wet prep collected - RPR - HIV antibody (with reflex) - CT/GC PCR (ACCUSWAB) - Herpes simplex virus culture - HSV(herpes smplx)abs-1+2(IgG+IgM)-bld - valACYclovir (VALTREX) 1000 MG tablet; Take 1 tablet (1,000 mg total) by mouth 2 (two) times daily.  Dispense: 20 tablet; Refill: 0 - fluconazole (DIFLUCAN) 150 MG tablet; Take 1 tablet (150 mg total) by mouth once.  Dispense: 1 tablet; Refill: 0 - F/U PRN or symptoms do  not resolve with treatment.

## 2015-08-26 NOTE — Progress Notes (Signed)
Pre visit review using our clinic review tool, if applicable. No additional management support is needed unless otherwise documented below in the visit note. 

## 2015-08-27 ENCOUNTER — Telehealth: Payer: Self-pay | Admitting: Family Medicine

## 2015-08-27 LAB — HSV(HERPES SMPLX)ABS-I+II(IGG+IGM)-BLD
HERPES SIMPLEX VRS I-IGM AB (EIA): 1.26 {index} — AB
HSV 1 Glycoprotein G Ab, IgG: 10.15 IV — ABNORMAL HIGH
HSV 2 Glycoprotein G Ab, IgG: <0.1 IV

## 2015-08-27 LAB — RPR

## 2015-08-27 LAB — HIV ANTIBODY (ROUTINE TESTING W REFLEX): HIV 1&2 Ab, 4th Generation: NONREACTIVE

## 2015-08-27 NOTE — Telephone Encounter (Signed)
I am not certain what type of "cream" she is referring. We are treating the yeast infection and HSV (presumed until labs returned) by oral  Medications. Any type of cream that she could use can be purchased OTC only if needed for comfort, but may cause increase irritation as well. She should be safe with external cream for yeast. She should not need to use the applicator any longer. We are still awaiting her lab results. - She can be out of work one more day Dawn Elliott([please send excuse), but she does not need to be off any longer after that. She will have some discomfort, but she see improvement once medication has been initiated.

## 2015-08-27 NOTE — Telephone Encounter (Signed)
Pt. Is requesting a rx for a cream and also was home from work again today because she is so sore. She will also needs another note for work.  Please call her at [phone] before calling in RX as she has a different Pharmacy she would like to use. Pt was told it would be at least noon before she heard back from CMA.

## 2015-08-27 NOTE — Telephone Encounter (Signed)
Please advise 

## 2015-08-27 NOTE — Telephone Encounter (Signed)
Patient is aware.  She states she read something on the internet that could help with the bumps but she couldn't remember what it was called.  I advised her to caution OTC creams as they may irritate her more.  She is aware that she should return to work tomorrow.  No further excuses for work will be written.  Pt had no questions at this time.

## 2015-08-28 ENCOUNTER — Encounter: Payer: Self-pay | Admitting: Family Medicine

## 2015-08-28 LAB — WET PREP BY MOLECULAR PROBE
Candida species: NEGATIVE
GARDNERELLA VAGINALIS: NEGATIVE
Trichomonas vaginosis: NEGATIVE

## 2015-08-29 ENCOUNTER — Encounter: Payer: Self-pay | Admitting: Family Medicine

## 2015-08-29 ENCOUNTER — Telehealth: Payer: Self-pay | Admitting: Family Medicine

## 2015-08-29 DIAGNOSIS — Z1159 Encounter for screening for other viral diseases: Secondary | ICD-10-CM

## 2015-08-29 DIAGNOSIS — B009 Herpesviral infection, unspecified: Secondary | ICD-10-CM | POA: Insufficient documentation

## 2015-08-29 LAB — GC/CHLAMYDIA PROBE AMP
CT Probe RNA: NEGATIVE
GC Probe RNA: NEGATIVE

## 2015-08-29 LAB — HERPES SIMPLEX VIRUS CULTURE: Organism ID, Bacteria: DETECTED

## 2015-08-29 NOTE — Telephone Encounter (Addendum)
Please call pt with results: - Gonorrhea, chlamydia negative, HIV and syphilis negative.  - Unknown is she had yeast since she had started the treatment cream prior, but I suspect she has yeast as well.  - Her blood work for it showed she has had type 1 in the past, which she knew, and did show the possibility of new infection/strand indicating, possible new infection with type 2. 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 - Herpes can be treated during infection, but suppressive therapy (daily medication to decrease outbreaks/transmission) is also an option. If this is something she is interested in, please have her make an appointment to discuss if she has questions. She should also be encouraged to discuss with her partner.

## 2015-08-29 NOTE — Telephone Encounter (Signed)
Called pt at a number she left for us two days prior and left a brief message for pt to CB since I can not reach her at her only current phone number in her chart.

## 2015-08-29 NOTE — Telephone Encounter (Addendum)
Pt returned call.  Pt was advised of results and had numerous questions.  Pt scheduled appt 09/03/15.  Also, changed patients phone number per her request.

## 2015-08-29 NOTE — Telephone Encounter (Signed)
Phone number continues to ring busy.  I have attempted twice this am.

## 2015-09-03 ENCOUNTER — Encounter: Payer: Self-pay | Admitting: Family Medicine

## 2015-09-03 ENCOUNTER — Ambulatory Visit (INDEPENDENT_AMBULATORY_CARE_PROVIDER_SITE_OTHER): Payer: BLUE CROSS/BLUE SHIELD | Admitting: Family Medicine

## 2015-09-03 DIAGNOSIS — Z1159 Encounter for screening for other viral diseases: Secondary | ICD-10-CM | POA: Diagnosis not present

## 2015-09-03 DIAGNOSIS — B009 Herpesviral infection, unspecified: Secondary | ICD-10-CM

## 2015-09-03 MED ORDER — VALACYCLOVIR HCL 500 MG PO TABS
500.0000 mg | ORAL_TABLET | Freq: Every day | ORAL | Status: DC
Start: 2015-09-03 — End: 2016-04-23

## 2015-09-03 NOTE — Progress Notes (Signed)
   Subjective:    Patient ID: Dawn Elliott, female    DOB: 02-24-1986, 29 y.o.   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 She reports her symptoms are greatly improved after taking extended course of Valtrex for her presumed initial outbreak. Wet prep negative for trichomoniasis,   BV, yeast, gonorrhea and Chlamydia. HIV and RPR were nonreactive. HSV 1 IgG 10.15, HSV2  IgG negative. HSV 1 and 2 IgM antibody 1.26. HSV 2 identified in viral culture.  Patient states that she has had a discussion with her partner. Her partner has been tested as well and is told he is "positive". He has received treatment for his initial outbreak as well. All lab results were reviewed with patient, and explained. Discussion on suppressive therapy, for herself and if she should have him be on suppressive therapy. Discussion on transmission and viral shedding.  Past Medical History  Diagnosis Date  . Allergy   . Urinary tract bacterial infections   . Scoliosis    Social History   Social History  . Marital Status: Single    Spouse Name: N/A  . Number of Children: 0  . Years of Education: College   Occupational History  . CNA    Social History Main Topics  . Smoking status: Never Smoker   . Smokeless tobacco: Never Used  . Alcohol Use: 0.0 oz/week    0 Standard drinks or equivalent per week  . Drug Use: No  . Sexual Activity: Yes    Birth Control/ Protection: IUD   Other Topics Concern  . Not on file   Social History Narrative   Ms Dawn [place] recently moved from [place]. 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     Review of Systems Negative, with the exception of above mentioned in HPI     Objective:   Physical Exam BP 106/72 mmHg  Pulse 79  Temp(Src) 98 F (36.7 C) (Oral)  Resp 16  Ht 5\' 3"  (1.6 m)  Wt 179 lb (81.194 kg)  BMI 31.72 kg/m2  SpO2 99% Gen: Afebrile. No acute distress. Nontoxic in appearance,  well-developed, well-nourished, Caucasian female. Abd: Soft. Obese. NTND. BS present. No Masses palpated.     Assessment & Plan:  1. PCR DNA positive for HSV2 - After review of all labs, discussion of suppressive therapy versus treatment with outbreak and transmission, patient decided she would like to try suppressive therapy. - Patient was given the opportunity to ask questions, patient asked multiple questions, all questions were answered. - valACYclovir (VALTREX) 500 MG tablet; Take 1 tablet (500 mg total) by mouth daily.  Dispense: 30 tablet; Refill: 1

## 2015-09-03 NOTE — Patient Instructions (Signed)
If you get an infection 500 mg twice a day for 3 days.  Other wise take 500 mg daily.

## 2015-09-30 ENCOUNTER — Ambulatory Visit (HOSPITAL_BASED_OUTPATIENT_CLINIC_OR_DEPARTMENT_OTHER)
Admission: RE | Admit: 2015-09-30 | Discharge: 2015-09-30 | Disposition: A | Discharge status: Home / Self Care | Payer: BLUE CROSS/BLUE SHIELD | Source: Ambulatory Visit | Attending: Family Medicine | Admitting: Family Medicine

## 2015-09-30 ENCOUNTER — Encounter: Payer: Self-pay | Admitting: Family Medicine

## 2015-09-30 ENCOUNTER — Ambulatory Visit (INDEPENDENT_AMBULATORY_CARE_PROVIDER_SITE_OTHER): Payer: BLUE CROSS/BLUE SHIELD | Admitting: Family Medicine

## 2015-09-30 ENCOUNTER — Other Ambulatory Visit (HOSPITAL_COMMUNITY)
Admission: RE | Admit: 2015-09-30 | Discharge: 2015-09-30 | Disposition: A | Discharge status: Home / Self Care | Payer: BLUE CROSS/BLUE SHIELD | Source: Ambulatory Visit | Attending: Family Medicine | Admitting: Family Medicine

## 2015-09-30 VITALS — BP 111/79 | HR 83 | Temp 98.2°F | Resp 20 | Wt 179.0 lb

## 2015-09-30 DIAGNOSIS — Z975 Presence of (intrauterine) contraceptive device: Secondary | ICD-10-CM | POA: Diagnosis not present

## 2015-09-30 DIAGNOSIS — Z8742 Personal history of other diseases of the female genital tract: Secondary | ICD-10-CM

## 2015-09-30 DIAGNOSIS — Z01419 Encounter for gynecological examination (general) (routine) without abnormal findings: Secondary | ICD-10-CM | POA: Diagnosis present

## 2015-09-30 DIAGNOSIS — R233 Spontaneous ecchymoses: Secondary | ICD-10-CM

## 2015-09-30 DIAGNOSIS — R238 Other skin changes: Secondary | ICD-10-CM | POA: Diagnosis not present

## 2015-09-30 DIAGNOSIS — R1032 Left lower quadrant pain: Secondary | ICD-10-CM | POA: Insufficient documentation

## 2015-09-30 DIAGNOSIS — R112 Nausea with vomiting, unspecified: Secondary | ICD-10-CM

## 2015-09-30 DIAGNOSIS — R197 Diarrhea, unspecified: Secondary | ICD-10-CM

## 2015-09-30 DIAGNOSIS — N83202 Unspecified ovarian cyst, left side: Secondary | ICD-10-CM | POA: Insufficient documentation

## 2015-09-30 LAB — POCT URINALYSIS DIPSTICK
Glucose, UA: NEGATIVE
Leukocytes, UA: NEGATIVE
NITRITE UA: NEGATIVE
PH UA: 6
Protein, UA: 30
SPEC GRAV UA: 1.025
UROBILINOGEN UA: 1

## 2015-09-30 LAB — POCT URINE PREGNANCY: PREG TEST UR: NEGATIVE

## 2015-09-30 LAB — WET PREP, GENITAL
Bacteria: NONE SEEN — AB
CLUE CELLS WET PREP: NONE SEEN — AB
Trich, Wet Prep: NONE SEEN — AB
WBC WET PREP: NONE SEEN — AB
YEAST WET PREP: NONE SEEN — AB

## 2015-09-30 MED ORDER — ONDANSETRON HCL 4 MG PO TABS: 4.0000 mg | ORAL_TABLET | Freq: Three times a day (TID) | ORAL | Status: AC | PRN

## 2015-09-30 NOTE — Patient Instructions (Addendum)
Viral Gastroenteritis Viral gastroenteritis is also known as stomach flu. This condition affects the stomach and intestinal tract. It can cause sudden diarrhea and vomiting. The illness typically lasts 3 to 8 days. Most people develop an immune response that eventually gets rid of the virus. While this natural response develops, the virus can make you quite ill. CAUSES  Many different viruses can cause gastroenteritis, such as rotavirus or noroviruses. You can catch one of these viruses by consuming contaminated food or water. You may also catch a virus by sharing utensils or other personal items with an infected person or by touching a contaminated surface. SYMPTOMS  The most common symptoms are diarrhea and vomiting. These problems can cause a severe loss of body fluids (dehydration) and a body salt (electrolyte) imbalance. Other symptoms may include:  Fever.  Headache.  Fatigue.  Abdominal pain. DIAGNOSIS  Your caregiver can usually diagnose viral gastroenteritis based on your symptoms and a physical exam. A stool sample may also be taken to test for the presence of viruses or other infections. TREATMENT  This illness typically goes away on its own. Treatments are aimed at rehydration. The most serious cases of viral gastroenteritis involve vomiting so severely that you are not able to keep fluids down. In these cases, fluids must be given through an intravenous line (IV). HOME CARE INSTRUCTIONS   Drink enough fluids to keep your urine clear or pale yellow. Drink small amounts of fluids frequently and increase the amounts as tolerated.  Ask your caregiver for specific rehydration instructions.  Avoid:  Foods high in sugar.  Alcohol.  Carbonated drinks.  Tobacco.  Juice.  Caffeine drinks.  Extremely hot or cold fluids.  Fatty, greasy foods.  Too much intake of anything at one time.  Dairy products until 24 to 48 hours after diarrhea stops.  You may consume probiotics.  Probiotics are active cultures of beneficial bacteria. They may lessen the amount and number of diarrheal stools in adults. Probiotics can be found in yogurt with active cultures and in supplements.  Wash your hands well to avoid spreading the virus.  Only take over-the-counter or prescription medicines for pain, discomfort, or fever as directed by your caregiver. Do not give aspirin to children. Antidiarrheal medicines are not recommended.  Ask your caregiver if you should continue to take your regular prescribed and over-the-counter medicines.  Keep all follow-up appointments as directed by your caregiver. SEEK IMMEDIATE MEDICAL CARE IF:   You are unable to keep fluids down.  You do not urinate at least once every 6 to 8 hours.  You develop shortness of breath.  You notice blood in your stool or vomit. This may look like coffee grounds.  You have abdominal pain that increases or is concentrated in one small area (localized).  You have persistent vomiting or diarrhea.  You have a fever.  The patient is a child younger than 3 months, and he or she has a fever.  The patient is a child older than 3 months, and he or she has a fever and persistent symptoms.  The patient is a child older than 3 months, and he or she has a fever and symptoms suddenly get worse.  The patient is a baby, and he or she has no tears when crying. MAKE SURE YOU:   Understand these instructions.  Will watch your condition.  Will get help right away if you are not doing well or get worse.   This information is not intended to replace  advice given to you by your health care provider. Make sure you discuss any questions you have with your health care provider.   Document Released: 11/01/2005 Document Revised: 01/24/2012 Document Reviewed: 08/18/2011 Elsevier Interactive Patient Education [employer]2016 [employer]   I have called in zofran for nausea. Stay hydrated! If unable to hold down liquids and your  urine decreases please be seen again.  You may just have a gastroenteritis. However, with your history I want to rule out tube infection, cyst etc.  If you have scar tissue in that area from prior cyst removal, diarrhea/Bowel movements may be irritating the area.  I will call you with all results once available. You can take advil for pain every 8 hours for pain, be certain not to go over label recommendations.

## 2015-09-30 NOTE — Progress Notes (Signed)
Subjective:    Patient ID: Dawn Elliott, female    DOB: [birth year], 29 y.o.   MRN: [id number]  HPI  LLL  Abdominal discomfort: Patient reports Friday evening she noticed some left lower abdominal discomfort prior to eating out. She states she had a few wings but felt upset her stomach. She reports this context, no other family members are ill. Patient states that she had normal bowel movements until yesterday, in which she has had watery diarrhea a few times a day. She has a history of irritable bowel syndrome, but does not feel this is consistent with his symptoms. She states on "Sunday she started vomiting. She was unable to hold down water at that time. Patient states that she's continued to vomit through yesterday. She has been unable to hold down some water, but solids are difficult and cause her nausea. She states the left lower abdominal pain hurts worse with sitting, and is speaking intercourse very painful. Patient has had a cyst removed on her left ovary in the past. Patient has a Mirena since 2014. Patient denies any vaginal discharge or irritation. Patient denies any dysuria or urinary frequency. She believes she may have had a mild fever yesterday, but did not take her temperature. She denies any chills. She also has noted she's been bruising easily and has multiple faded bruises on forearms and anterior thigh.  Past Medical History  Diagnosis Date  . Allergy   . Urinary tract bacterial infections   . Scoliosis   . Ovarian cyst     left  . Abnormal Pap smear of cervix     20" 14   Allergies  Allergen Reactions  . Eggs Or Egg-Derived Products Anaphylaxis  . Tape Itching  . Ceclor [Cefaclor] Other (See Comments)    Burning sensation on skin  . Motrin [Ibuprofen] Other (See Comments)    Burning sensation on skin  . Prednisone Palpitations  . Sudafed [Pseudoephedrine] Other (See Comments)    Burning sensation on skin   Social History   Social History  . Marital Status: Single     Spouse Name: N/A  . Number of Children: 0  . Years of Education: College   Occupational History  . CNA    Social History Main Topics  . Smoking status: Never Smoker   . Smokeless tobacco: Never Used  . Alcohol Use: 0.0 oz/week    0 Standard drinks or equivalent per week  . Drug Use: No  . Sexual Activity: Yes    Birth Control/ Protection: IUD   Other Topics Concern  . Not on file   Social History Narrative   [place]wn [place] recently [place] from [place]. 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    Review of Systems Negative, with the exception of above mentioned in HPI     Objective:   Physical Exam BP 111/79 mmHg  Pulse 83  Temp(Src) 98.2 F (36.8 C) (Oral)  Resp 20  Wt 179 lb (81.194 kg)  SpO2 98% Gen: Afebrile. No acute distress. Nontoxic in appearance, well-developed, well-nourished, Caucasian female. HENT: AT. [place].  MMM.  Eyes:Pupils Equal Round Reactive to light, Extraocular movements intact,  Conjunctiva without redness, discharge or icterus. Abd: Soft. Flat. Nondistended, mild tender to palpation right lower quadrant and left lower quadrant. No rebound tenderness, no guarding. BS present. No Masses palpated.  GYN:  External genitalia within normal limits.  Vaginal mucosa pink, moist, normal rugae.  Nonfriable cervix without lesions, no discharge or  bleeding noted on speculum exam.  Bimanual exam revealed normal, nongravid uterus.  No cervical motion tenderness. No adnexal masses bilaterally.  IUD strings located at cervical os.     Assessment & Plan:  Abdominal pain, left lower quadrant/history of ovarian cyst - POCT urine pregnancy--> negative - POCT urinalysis dipstick--> trace ketones, small blood otherwise negative - Cytology - PAP - GC/Chlamydia Probe Amp - Wet prep, genital - US Pelvis Complete; Future - US Transvaginal Non-OB; Future  Nausea and vomiting, intractability of vomiting not specified, unspecified vomiting  type/diarrhea - Discussed with patient possibly could be gastroenteritis, Zofran prescribed for nausea. - Patient encouraged to watch for signs and symptoms of dehydration, attempt to maintain good hydration.   Easy bruising - Patient has been taking NSAIDs for abdominal pain, however not chronically. Bruises are old, and feeding. Patient denies any trauma. - CBC w/Diff - Ferritin - INR/PT  Follow-up one week if no improvement

## 2015-10-01 ENCOUNTER — Encounter: Payer: Self-pay | Admitting: Family Medicine

## 2015-10-01 ENCOUNTER — Telehealth: Payer: Self-pay | Admitting: Family Medicine

## 2015-10-01 DIAGNOSIS — N941 Unspecified dyspareunia: Secondary | ICD-10-CM

## 2015-10-01 DIAGNOSIS — R1032 Left lower quadrant pain: Secondary | ICD-10-CM

## 2015-10-01 DIAGNOSIS — N83202 Unspecified ovarian cyst, left side: Secondary | ICD-10-CM

## 2015-10-01 LAB — CBC WITH DIFFERENTIAL/PLATELET
BASOS ABS: 0 10*3/uL (ref 0.0–0.1)
Basophils Relative: 0.4 % (ref 0.0–3.0)
EOS PCT: 0.6 % (ref 0.0–5.0)
Eosinophils Absolute: 0 10*3/uL (ref 0.0–0.7)
HEMATOCRIT: 40.1 % (ref 36.0–46.0)
Hemoglobin: 13.1 g/dL (ref 12.0–15.0)
LYMPHS ABS: 1.5 10*3/uL (ref 0.7–4.0)
LYMPHS PCT: 29 % (ref 12.0–46.0)
MCHC: 32.7 g/dL (ref 30.0–36.0)
MCV: 96.6 fl (ref 78.0–100.0)
MONOS PCT: 7.8 % (ref 3.0–12.0)
Monocytes Absolute: 0.4 10*3/uL (ref 0.1–1.0)
NEUTROS PCT: 62.2 % (ref 43.0–77.0)
Neutro Abs: 3.3 10*3/uL (ref 1.4–7.7)
Platelets: 216 10*3/uL (ref 150.0–400.0)
RBC: 4.15 Mil/uL (ref 3.87–5.11)
RDW: 15.2 % (ref 11.5–15.5)
WBC: 5.3 10*3/uL (ref 4.0–10.5)

## 2015-10-01 LAB — FERRITIN: Ferritin: 116.3 ng/mL (ref 10.0–291.0)

## 2015-10-01 LAB — GC/CHLAMYDIA PROBE AMP
CT Probe RNA: NEGATIVE
GC PROBE AMP APTIMA: NEGATIVE

## 2015-10-01 LAB — CYTOLOGY - PAP

## 2015-10-01 LAB — PROTIME-INR
INR: 1.31 (ref <1.50)
Prothrombin Time: 16.5 seconds — ABNORMAL HIGH (ref 11.6–15.2)

## 2015-10-01 NOTE — Telephone Encounter (Signed)
Spoke with patient reviewed US results and referral information. Patient verbalized understanding.

## 2015-10-01 NOTE — Telephone Encounter (Signed)
Please call pt: - I am still waiting on some of her labs, she will get a call once they all resulted.  - Her US did show a new left "mildly complicated" ovarian cyst ~3 x 1.6cm. This needs to be evaluated by Gynecology.  - I have placed this referral today for her and she should be contacted to schedule soon.

## 2015-10-02 ENCOUNTER — Encounter: Payer: Self-pay | Admitting: *Deleted

## 2015-10-02 ENCOUNTER — Telehealth: Payer: Self-pay | Admitting: *Deleted

## 2015-10-02 NOTE — Telephone Encounter (Signed)
Spoke with Dr Claiborne BillingsKuneff ok to give work note for work for yesterday and today. Patient to try clear liquids and progress to bland diet then regular diet as tolerated. Take zofran as prescribed. Spoke with patient reviewed instructions. Patient verbalized understanding . Work note mailed to patient at her request.

## 2015-10-02 NOTE — Telephone Encounter (Signed)
Patient called states she has had vomiting yesterday and today. She states her boyfriend now has similar symptoms so she thinks this might be a virus. She is requesting something for the nausea and vomiting for herself and a work note since she missed yesterday and today from work. Please advise.

## 2015-10-03 ENCOUNTER — Telehealth: Payer: Self-pay | Admitting: Family Medicine

## 2015-10-03 DIAGNOSIS — R233 Spontaneous ecchymoses: Secondary | ICD-10-CM

## 2015-10-03 DIAGNOSIS — R238 Other skin changes: Secondary | ICD-10-CM

## 2015-10-03 NOTE — Telephone Encounter (Signed)
Please call pt: - her PAP was normal, as well as her gonorrhea and chlamydia (negative), wet prep was also normal.  - She should be encouraged to follow up with her GYN as soon as possible for her cyst, since it was considered a complicated cyst, this needs to be followed up without delay. (I had placed a referral as soon as possible to gynecology, patient called back and stated she wanted to set up something on her own) - Towards the end of our exam, patient had complained of increased/easy bruising. This screening test did show a mild elevation. I have placed a future order for her to get additional lab work to further evaluate her bruising. If these lab results come back abnormal, I will need to follow-up with the patient in office to discuss results and further evaluation. Please have her schedule up appointment at her earliest convenience to have blood drawn.

## 2015-10-03 NOTE — Telephone Encounter (Signed)
Left message for patient to call back  

## 2015-10-06 ENCOUNTER — Other Ambulatory Visit: Payer: Self-pay | Admitting: Family Medicine

## 2015-10-06 NOTE — Telephone Encounter (Signed)
Spoke with patient reviewed lab results . Patient states she would like her labs drawn at an outside lab since she lives an hour away . She will call back with l;ab information.

## 2015-10-06 NOTE — Telephone Encounter (Signed)
Patient called back she wants lab orders [facility]ed [facility] [place] . [facility] requisitions [facility] to [facility] [phone]

## 2015-10-06 NOTE — Telephone Encounter (Signed)
Left message for patient to return call to review lab results. 

## 2015-10-07 ENCOUNTER — Telehealth: Payer: Self-pay | Admitting: Family Medicine

## 2015-10-07 LAB — APTT: aPTT: 29 s (ref 24–33)

## 2015-10-07 LAB — PROTIME-INR
INR: 1 (ref 0.8–1.2)
PROTHROMBIN TIME: 10.6 s (ref 9.1–12.0)

## 2015-10-07 NOTE — Telephone Encounter (Signed)
Please call pt: - her labs are normal.  

## 2015-10-07 NOTE — Telephone Encounter (Signed)
Left message with results on patient voice mail 

## 2016-04-23 ENCOUNTER — Other Ambulatory Visit: Payer: Self-pay | Admitting: *Deleted

## 2016-04-23 ENCOUNTER — Encounter: Payer: Self-pay | Admitting: Family Medicine

## 2016-04-23 DIAGNOSIS — B009 Herpesviral infection, unspecified: Secondary | ICD-10-CM

## 2016-04-23 DIAGNOSIS — Z1159 Encounter for screening for other viral diseases: Principal | ICD-10-CM

## 2016-04-23 MED ORDER — VALACYCLOVIR HCL 500 MG PO TABS: 500.0000 mg | ORAL_TABLET | Freq: Every day | ORAL | Status: DC

## 2016-04-23 NOTE — Telephone Encounter (Signed)
Valcyclovir refilled

## 2016-05-25 ENCOUNTER — Encounter: Payer: Self-pay | Admitting: Family Medicine

## 2016-05-26 ENCOUNTER — Other Ambulatory Visit: Payer: Self-pay | Admitting: Family Medicine

## 2016-05-26 DIAGNOSIS — Z1159 Encounter for screening for other viral diseases: Principal | ICD-10-CM

## 2016-05-26 DIAGNOSIS — B009 Herpesviral infection, unspecified: Secondary | ICD-10-CM

## 2016-05-26 MED ORDER — VALACYCLOVIR HCL 500 MG PO TABS: 500.0000 mg | ORAL_TABLET | Freq: Every day | ORAL | Status: AC

## 2016-08-12 ENCOUNTER — Encounter: Payer: Self-pay | Admitting: Family Medicine

## 2016-11-15 IMAGING — US US TRANSVAGINAL NON-OB
1 series · 13 of 25 positions shown · non-contrast
Comparison: None

CLINICAL DATA: LEFT lower quadrant pain for 5 days, really sharp on
[REDACTED], associated nausea, vomiting, diarrhea ; patient reports
prior partial resection LEFT ovary



[Series 1: us transvaginal non-ob · 0.20mm/px · 13 of 58 slices shown]
[im 1/58]
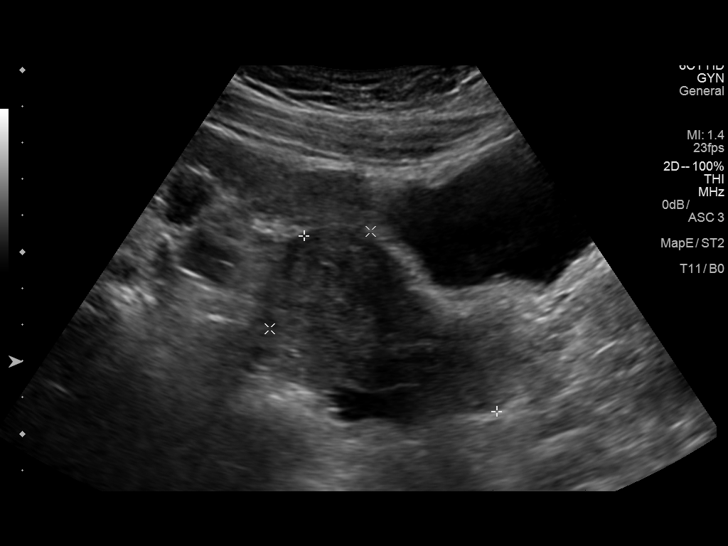
[im 5/58]
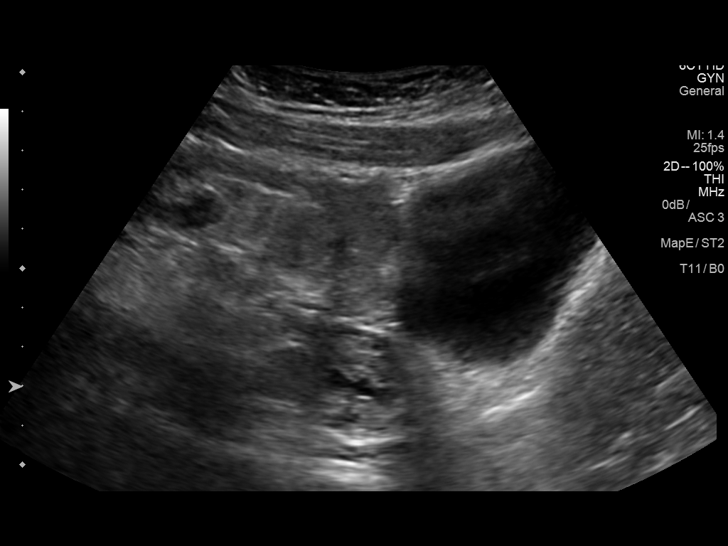
[im 10/58]
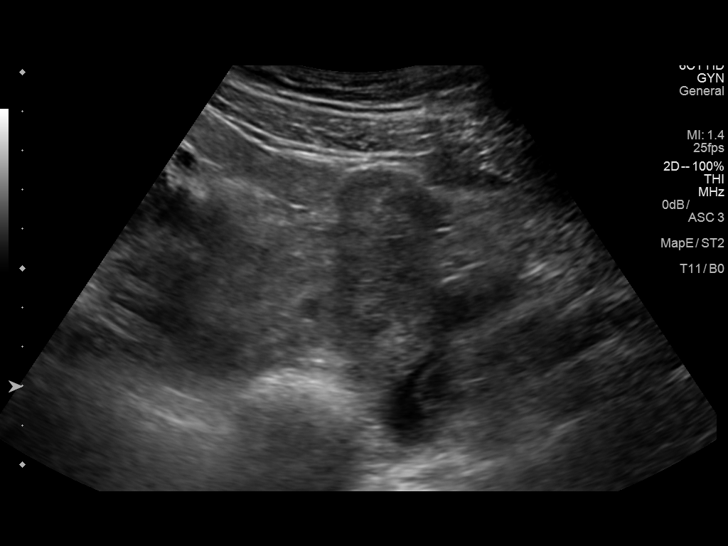
[im 15/58]
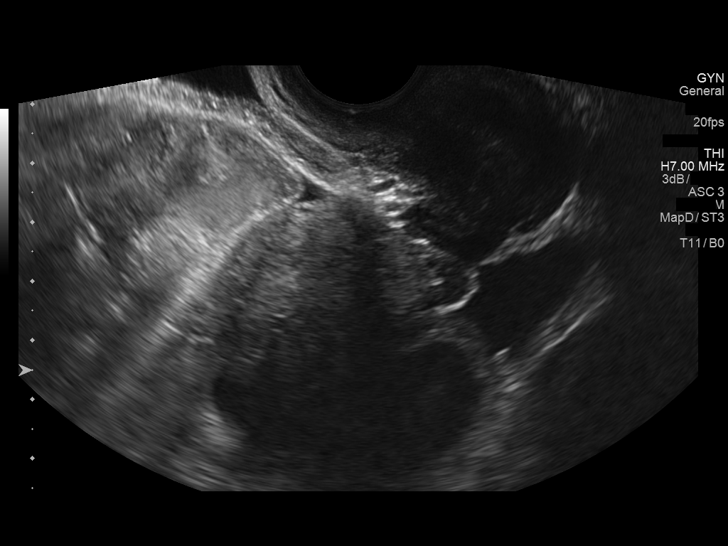
[im 20/58]
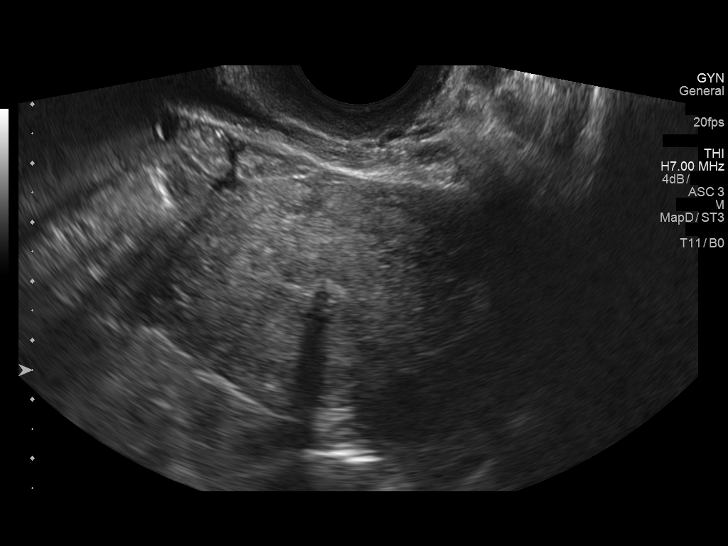
[im 24/58]
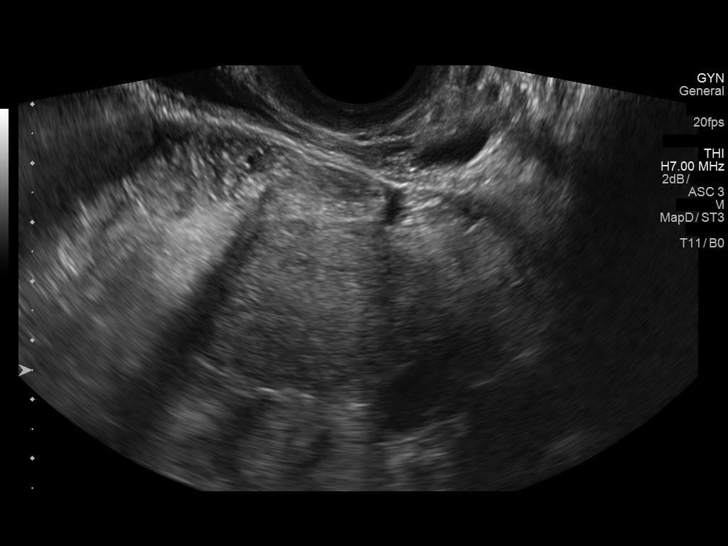
[im 29/58]
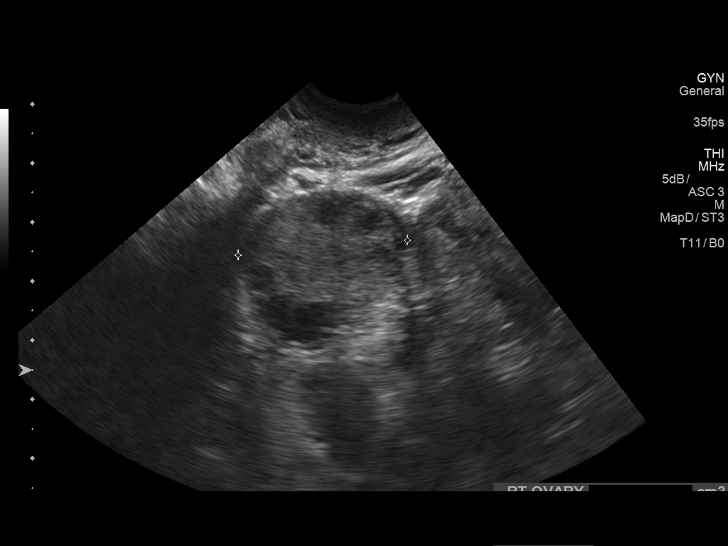
[im 34/58]
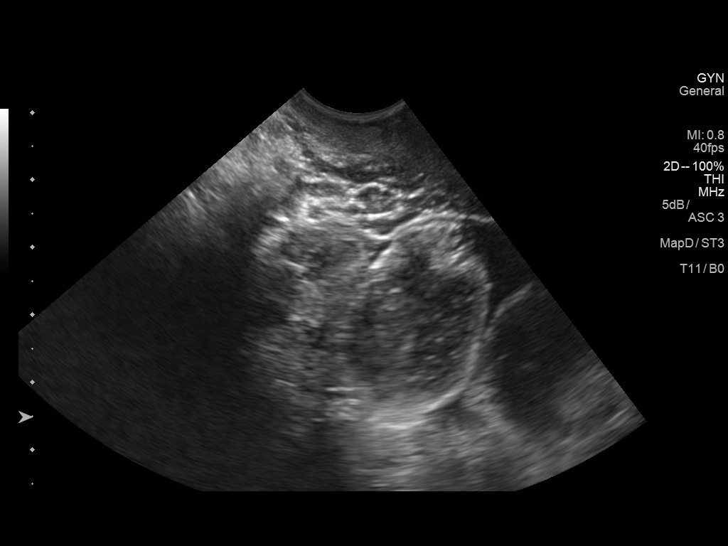
[im 39/58]
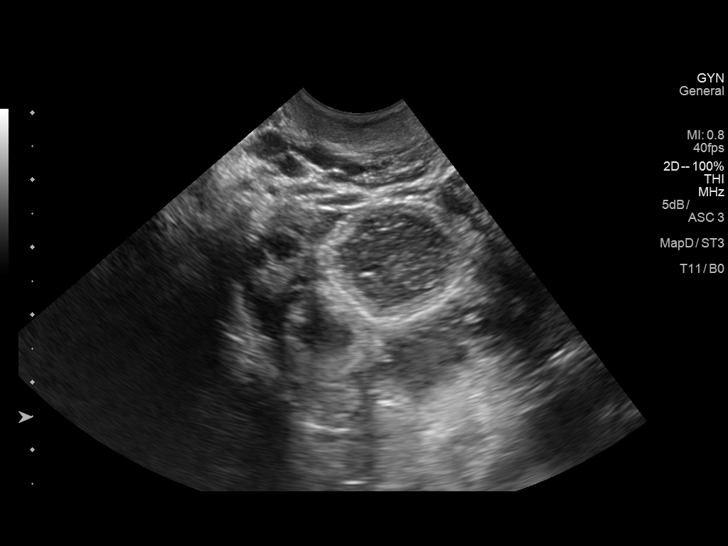
[im 43/58]
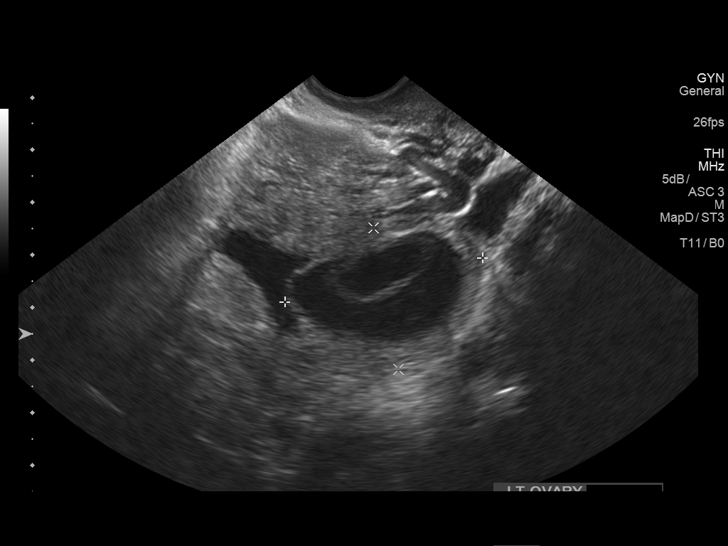
[im 48/58]
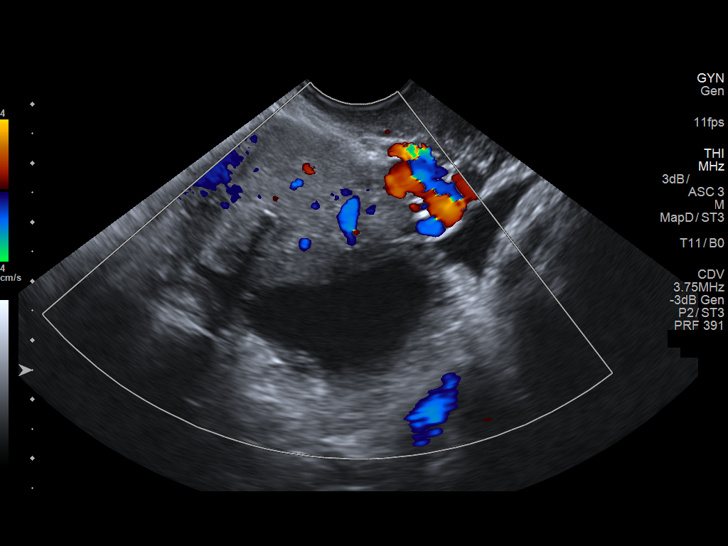
[im 53/58]
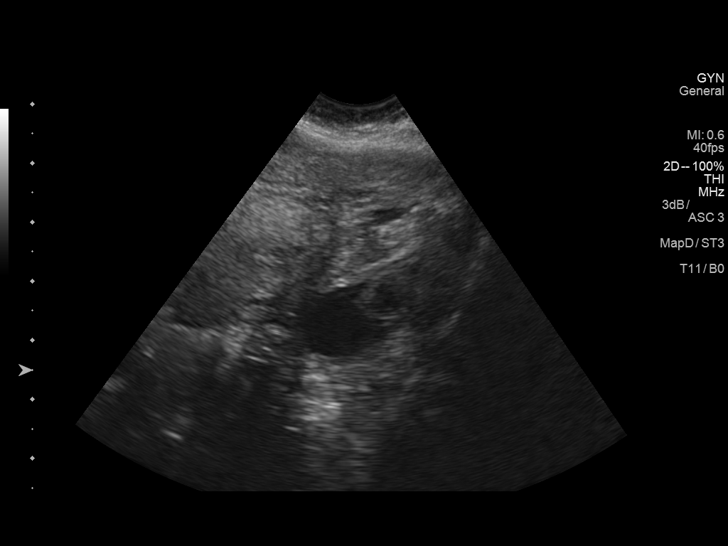
[im 58/58]
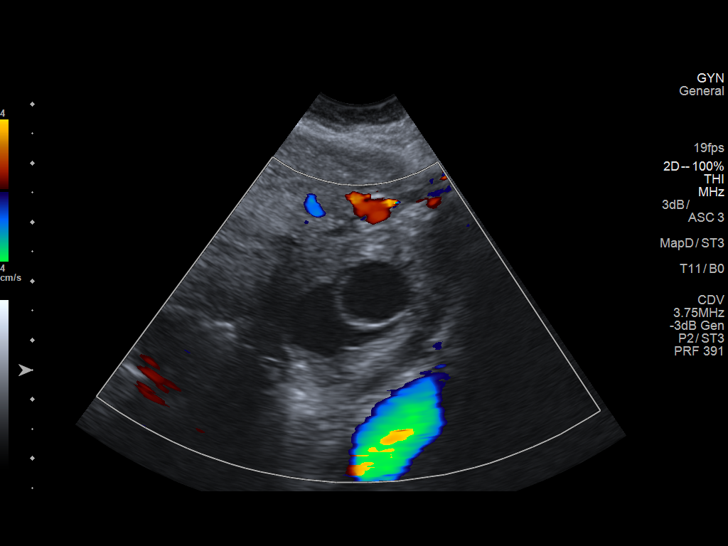

[13 of 25 positions shown; findings below may reference images not displayed]

FINDINGS: Uterus

Measurements: 7.6 x 3.9 x 5.5 cm. Normal morphology without mass.

Endometrium

Thickness: 5 mm thick. IUD present at upper to mid uterus. No
endometrial fluid.

Right ovary

Measurements: 3.3 x 2.2 x 2.9 cm. Normal morphology without mass.
Internal blood flow present on color Doppler imaging.

Left ovary

Measurements: 3.9 x 2.7 x 2.2 cm. Complex cyst with thin internal
septation within LEFT ovary 3.3 x 1.6 x 1.6 cm. No additional
ovarian mass.

Other findings

Moderate amount of simple appearing free pelvic fluid.
IMPRESSION: IUD within uterus.

Otherwise unremarkable uterus and RIGHT ovary.

Mildly complicated LEFT ovarian cyst 3.3 x 1.6 x 1.6 cm.

Moderate simple-appearing free pelvic fluid.
